# Patient Record
Sex: Male | Born: 1980 | Hispanic: Yes | Marital: Married | Smoking: Never smoker
Health system: Southern US, Community
[De-identification: ages and names within clinical notes are randomized; demographics above are authoritative.]

---

## 2019-04-25 ENCOUNTER — Emergency Department (HOSPITAL_COMMUNITY)
Admission: EM | Admit: 2019-04-25 | Discharge: 2019-04-26 | Disposition: A | Discharge status: Home / Self Care | Payer: HRSA Program | Attending: Emergency Medicine | Admitting: Emergency Medicine

## 2019-04-25 ENCOUNTER — Other Ambulatory Visit: Payer: Self-pay

## 2019-04-25 ENCOUNTER — Encounter (HOSPITAL_COMMUNITY): Payer: Self-pay

## 2019-04-25 ENCOUNTER — Emergency Department (HOSPITAL_COMMUNITY): Payer: HRSA Program

## 2019-04-25 DIAGNOSIS — R05 Cough: Secondary | ICD-10-CM | POA: Diagnosis present

## 2019-04-25 DIAGNOSIS — R0981 Nasal congestion: Secondary | ICD-10-CM | POA: Insufficient documentation

## 2019-04-25 DIAGNOSIS — R030 Elevated blood-pressure reading, without diagnosis of hypertension: Secondary | ICD-10-CM | POA: Diagnosis not present

## 2019-04-25 DIAGNOSIS — J189 Pneumonia, unspecified organism: Secondary | ICD-10-CM | POA: Diagnosis not present

## 2019-04-25 DIAGNOSIS — Z20828 Contact with and (suspected) exposure to other viral communicable diseases: Secondary | ICD-10-CM | POA: Diagnosis not present

## 2019-04-25 DIAGNOSIS — Z20822 Contact with and (suspected) exposure to covid-19: Secondary | ICD-10-CM

## 2019-04-25 MED ORDER — FLUTICASONE PROPIONATE 50 MCG/ACT NA SUSP: 1.0000 | Freq: Every day | NASAL | 0 refills | Status: DC

## 2019-04-25 MED ORDER — BENZONATATE 100 MG PO CAPS: 100.0000 mg | ORAL_CAPSULE | Freq: Three times a day (TID) | ORAL | 0 refills | Status: AC

## 2019-04-25 MED ORDER — AZITHROMYCIN 250 MG PO TABS: 250.0000 mg | ORAL_TABLET | Freq: Every day | ORAL | 0 refills | Status: AC

## 2019-04-25 NOTE — Discharge Instructions (Signed)
Take the medicines as prescribed.  Follow-up with primary care.  Return to the emergency department for any new worsening symptoms.  You will be called her COVID-19 results.

## 2019-04-25 NOTE — ED Triage Notes (Signed)
Pt reports nasal congestion for the past 3 days and slight cough, denies fever. Resp e/u.

## 2019-04-25 NOTE — ED Provider Notes (Signed)
[facility] EMERGENCY DEPARTMENT Provider Note   CSN: [id number] Arrival date & time: 04/25/19  2225    History   Chief Complaint Chief Complaint  Patient presents with  . Nasal Congestion    HPI Dale Rice is a 38 y.o. male with past medical history significant for hypertension who presents for evaluation of cough and nasal congestion.  Patient states he is had nonproductive cough x3 days.  Has had clear nasal congestion and rhinorrhea.  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  Denies fever, chills, nausea, vomiting, headache, neck pain, neck stiffness, sore throat, drooling, dysphasia, trismus, chest pain, shortness of breath, abdominal pain, diarrhea dysuria.  Has not taken anything for symptoms.  Denies additional aggravating or alleviating factors.  History obtained from patient and past medical records.  No interpreter was used.     HPI  History reviewed. No pertinent past medical history.  There are no active problems to display for this patient.   History reviewed. No pertinent surgical history.      Home Medications    Prior to Admission medications   Medication Sig Start Date End Date Taking? Authorizing Provider  azithromycin (ZITHROMAX) 250 MG tablet Take 1 tablet (250 mg total) by mouth daily. Take first 2 tablets together, then 1 every day until finished. 04/25/19   Toshiro Hanken A, PA-C  benzonatate (TESSALON) 100 MG capsule Take 1 capsule (100 mg total) by mouth every 8 (eight) hours. 04/25/19   Ahaana Rochette A, PA-C  fluticasone (FLONASE) 50 MCG/ACT nasal spray Place 1 spray into both nostrils daily. 04/25/19   Jannetta Massey A, PA-C    Family History No family history on file.  Social History Social History   Tobacco Use  . Smoking status: Not on file  Substance Use Topics  . Alcohol use: Not on file  . Drug use: Not on file     Allergies   Patient has no allergy  information on record.   Review of Systems Review of Systems  Constitutional: Negative.   HENT: Positive for congestion and rhinorrhea. Negative for drooling, ear discharge, ear pain, facial swelling, mouth sores, nosebleeds, sinus pressure, sinus pain, sneezing, sore throat, trouble swallowing and voice change.   Eyes: Negative.   Respiratory: Positive for cough. Negative for apnea, choking, chest tightness, shortness of breath, wheezing and stridor.   Cardiovascular: Negative.   Gastrointestinal: Negative.   Genitourinary: Negative.   Musculoskeletal: Negative.   Skin: Negative.   Neurological: Negative.   All other systems reviewed and are negative.    Physical Exam Updated Vital Signs BP (!) 176/107 (BP Location: Left Arm)   Pulse 97   Temp 98.1 F (36.7 C) (Oral)   Resp 16   SpO2 98%   Physical Exam Vitals signs and nursing note reviewed.  Constitutional:      General: He is not in acute distress.    Appearance: He is not ill-appearing, toxic-appearing or diaphoretic.  HENT:     Head: Normocephalic and atraumatic.     Jaw: There is normal jaw occlusion.     Right Ear: Tympanic membrane, ear canal and external ear normal. There is no impacted cerumen. No hemotympanum. Tympanic membrane is not injected, scarred, perforated, erythematous, retracted or bulging.     Left Ear: Tympanic membrane, ear canal and external ear normal. There is no impacted cerumen. No hemotympanum. Tympanic membrane is not injected, scarred, perforated, erythematous, retracted or bulging.  Ears:     Comments: No Mastoid tenderness.    Nose:     Comments: Clear rhinorrhea and congestion to bilateral nares.  No sinus tenderness.    Mouth/Throat:     Comments: Posterior oropharynx clear.  Mucous membranes moist.  Tonsils without erythema or exudate.  Uvula midline without deviation.  No evidence of PTA or RPA.  No drooling, dysphasia or trismus.  Phonation normal. Neck:     Trachea: Trachea and  phonation normal.     Meningeal: Brudzinski's sign and Kernig's sign absent.     Comments: No Neck stiffness or neck rigidity.  No meningismus.  No cervical lymphadenopathy. Cardiovascular:     Comments: No murmurs rubs or gallops. Pulmonary:     Comments: Clear to auscultation bilaterally without wheeze, rhonchi or rales.  No accessory muscle usage.  Able speak in full sentences. Abdominal:     Comments: Soft, nontender without rebound or guarding.  No CVA tenderness.  Musculoskeletal:     Comments: Moves all 4 extremities without difficulty.  Lower extremities without edema, erythema or warmth.  Skin:    Comments: Brisk capillary refill.  No rashes or lesions.  Neurological:     Mental Status: He is alert.     Comments: Ambulatory in department without difficulty.  Cranial nerves II through XII grossly intact.  No facial droop.  No aphasia.      ED Treatments / Results  Labs (all labs ordered are listed, but only abnormal results are displayed) Labs Reviewed  NOVEL CORONAVIRUS, NAA (HOSPITAL ORDER, SEND-OUT TO REF LAB)    EKG None  Radiology Dg Chest Portable 1 View  Result Date: 04/25/2019 CLINICAL DATA:  38 year old male with cough and congestion. Test for COVID-19 pending. EXAM: PORTABLE CHEST 1 VIEW COMPARISON:  None. FINDINGS: Portable AP semi upright view at 2313 hours. Peripheral, confluent and indistinct bilateral pulmonary opacity, relatively sparing the lung apices. No pleural effusion is evident. Cardiac and mediastinal contours are within normal limits. Visualized tracheal air column is within normal limits. Upper lung pulmonary vascularity appears normal. Negative visible osseous structures. Paucity of bowel gas in the upper abdomen. IMPRESSION: Bilateral confluent pulmonary opacity most suspicious for pneumonia, and consider viral/atypical etiology. No pleural effusion. Electronically Signed   By: Odessa FlemingH  Hall M.D.   On: 04/25/2019 23:36    Procedures Procedures  (including critical care time)  Medications Ordered in ED Medications - No data to display   Initial Impression / Assessment and Plan / ED Course  I have reviewed the triage vital signs and the nursing notes.  Pertinent labs & imaging results that were available during my care of the patient were reviewed by me and considered in my medical decision making (see chart for details).  38 year old male appears otherwise well presents for evaluation of upper respiratory symptoms.  He is afebrile, nonseptic, non-ill-appearing.  Posterior oropharynx clear.  Mucous membranes moist.  No evidence of PTA or RPA.  Tolerating p.o. intake at home.  Heart and lungs clear.  Speaks in full sentences without difficulty.  Clear rhinorrhea and congestion to bilateral nares.  Abdomen soft, nontender without rebound or guarding.  Chest x-ray with bilateral pulmonary opacities most suspicion for pneumonia, consider viral/atypical in etiology.  Given close contact with COVID exposure will obtain outpatient test.  He is not appear ill.  Does not meet Sirs or sepsis criteria.  He is without tachycardia, tachypnea or hypoxia.  He is hypertensive in the emergency department.  Has history of hypertension  is not followed by PCP for this.  He is without nausea, vomiting, headache, chest pain, shortness of breath, abdominal pain.  I have low suspicion for hypertensive urgency or emergency.  Discussed with patient DASH diet.  Do not think he needs to be started on blood pressure medicine at this time from the emergency department standpoint.  Will refer outpatient PCP for reevaluation of his blood pressure.  Possibly transient elevated due to his visit in the emergency department.  Strict return precautions with patient.  Patient voiced understanding is agreeable for follow-up.  The patient has been appropriately medically screened and/or stabilized in the ED. I have low suspicion for any other emergent medical condition which would  require further screening, evaluation or treatment in the ED or require inpatient management.  Patient is hemodynamically stable and in no acute distress.  Patient able to ambulate in department prior to ED.  Evaluation does not show acute pathology that would require ongoing or additional emergent interventions while in the emergency department or further inpatient treatment.  I have discussed the diagnosis with the patient and answered all questions.  Patient has no further complaints prior to discharge.  Patient is comfortable with plan discussed in room and is stable for discharge at this time.  I have discussed strict return precautions for returning to the emergency department.  Patient was encouraged to follow-up with PCP/specialist refer to at discharge.      Final Clinical Impressions(s) / ED Diagnoses   Final diagnoses:  Atypical pneumonia  Close Exposure to Covid-19 Virus  Elevated blood pressure, situational    ED Discharge Orders         Ordered    azithromycin (ZITHROMAX) 250 MG tablet  Daily     04/25/19 2357    benzonatate (TESSALON) 100 MG capsule  Every 8 hours     04/25/19 2357    fluticasone (FLONASE) 50 MCG/ACT nasal spray  Daily     04/25/19 2357           Hayden Kihara A, PA-C 04/26/19 0004    Gerhard MunchLockwood, Robert, MD 05/01/19 1914

## 2019-04-27 LAB — NOVEL CORONAVIRUS, NAA (HOSP ORDER, SEND-OUT TO REF LAB; TAT 18-24 HRS): SARS-CoV-2, NAA: NOT DETECTED

## 2019-05-13 ENCOUNTER — Emergency Department (HOSPITAL_COMMUNITY)
Admission: EM | Admit: 2019-05-13 | Discharge: 2019-05-13 | Disposition: A | Discharge status: Home / Self Care | Payer: Self-pay | Attending: Emergency Medicine | Admitting: Emergency Medicine

## 2019-05-13 ENCOUNTER — Encounter (HOSPITAL_COMMUNITY): Payer: Self-pay | Admitting: Emergency Medicine

## 2019-05-13 ENCOUNTER — Other Ambulatory Visit: Payer: Self-pay

## 2019-05-13 ENCOUNTER — Emergency Department (HOSPITAL_COMMUNITY): Payer: Self-pay

## 2019-05-13 DIAGNOSIS — R0789 Other chest pain: Secondary | ICD-10-CM | POA: Insufficient documentation

## 2019-05-13 DIAGNOSIS — R6889 Other general symptoms and signs: Secondary | ICD-10-CM | POA: Insufficient documentation

## 2019-05-13 DIAGNOSIS — R05 Cough: Secondary | ICD-10-CM | POA: Insufficient documentation

## 2019-05-13 DIAGNOSIS — Z20822 Contact with and (suspected) exposure to covid-19: Secondary | ICD-10-CM

## 2019-05-13 DIAGNOSIS — R0981 Nasal congestion: Secondary | ICD-10-CM | POA: Insufficient documentation

## 2019-05-13 MED ORDER — AZELASTINE HCL 0.15 % NA SOLN: NASAL | 0 refills | Status: AC

## 2019-05-13 MED ORDER — PROMETHAZINE-DM 6.25-15 MG/5ML PO SYRP: ORAL_SOLUTION | ORAL | 0 refills | Status: AC

## 2019-05-13 NOTE — ED Provider Notes (Signed)
[facility] EMERGENCY DEPARTMENT Provider Note   CSN: [id number] Arrival date & time: 05/13/19  2047    History   Chief Complaint Chief Complaint  Patient presents with  . Shortness of Breath    HPI Dale Rice is a 38 y.o. male with a history of nasal bone fracture who presents to the emergency department with a chief complaint of nasal congestion.  The patient was seen in the ER on July 14 and had a chest x-ray that was concerning for viral atypical pneumonia.  He had a COVID-19 test performed, which was negative, but given the patient's chest x-ray the test was suspected to be a false negative.  He reports that he had been having symptoms for 8 days prior to his evaluation in the ER, which included nonproductive cough, nasal congestion, loss of sense of smell, shortness of breath, and chest tightness.   He reports that the symptoms resolved, but nasal congestion returned 3 days ago.  He reports that he has been having some mild intermittent chest tightness, but no shortness of breath.  No pleuritic or exertional chest pain. No fevers, chills, rhinorrhea, sinus pain or pressure, facial swelling, sore throat, otalgia, postnasal drip, N/V/D, or epistaxis.   He reports a remote history of a nasal bone fracture several years ago when he was living in [place].     The history is provided by the patient. A language interpreter was used ([place]).    History reviewed. No pertinent past medical history.  There are no active problems to display for this patient.   History reviewed. No pertinent surgical history.      Home Medications    Prior to Admission medications   Medication Sig Start Date End Date Taking? Authorizing Provider  Azelastine HCl 0.15 % SOLN Insert 1 spray in each nostril 2 times daily as needed.  Inserte 1 spray en cada fosa nasal 2 veces al da segn sea necesario. 05/13/19   Noralee Dutko A, PA-C  azithromycin (ZITHROMAX) 250 MG tablet Take  1 tablet (250 mg total) by mouth daily. Take first 2 tablets together, then 1 every day until finished. 04/25/19   Henderly, Britni A, PA-C  benzonatate (TESSALON) 100 MG capsule Take 1 capsule (100 mg total) by mouth every 8 (eight) hours. 04/25/19   Henderly, Britni A, PA-C  fluticasone (FLONASE) 50 MCG/ACT nasal spray Place 1 spray into both nostrils daily. 04/25/19   Henderly, Britni A, PA-C  promethazine-dextromethorphan (PROMETHAZINE-DM) 6.25-15 MG/5ML syrup Take 5 mL by mouth every 6 hours as needed. Tome 5 ml por va oral cada 6 horas segn sea necesario. 05/13/19   Sharlotte Baka A, PA-C    Family History No family history on file.  Social History Social History   Tobacco Use  . Smoking status: Never Smoker  . Smokeless tobacco: Never Used  Substance Use Topics  . Alcohol use: Never    Frequency: Never  . Drug use: Never     Allergies   Patient has no known allergies.   Review of Systems Review of Systems  Constitutional: Negative for appetite change, chills and fever.  HENT: Positive for congestion. Negative for ear pain, facial swelling, nosebleeds, postnasal drip, rhinorrhea, sinus pressure, sore throat, trouble swallowing and voice change.   Eyes: Negative for photophobia and visual disturbance.  Respiratory: Positive for cough and chest tightness. Negative for apnea, choking, shortness of breath and wheezing.   Cardiovascular: Negative for chest pain, palpitations and leg swelling.  Gastrointestinal: Negative  for abdominal pain, diarrhea, nausea and vomiting.  Genitourinary: Negative for dysuria and urgency.  Musculoskeletal: Negative for back pain.  Skin: Negative for rash.  Allergic/Immunologic: Negative for immunocompromised state.  Neurological: Negative for dizziness, seizures, weakness and headaches.  Psychiatric/Behavioral: Negative for confusion.     Physical Exam Updated Vital Signs BP (!) 143/95   Pulse (!) 58   Temp 99.6 F (37.6 C) (Oral)   Resp  17   Wt 86.2 kg   SpO2 100%   Physical Exam Vitals signs and nursing note reviewed.  Constitutional:      General: He is not in acute distress.    Appearance: He is well-developed. He is not ill-appearing, toxic-appearing or diaphoretic.  HENT:     Head: Normocephalic.     Nose: Congestion present. No septal deviation or rhinorrhea.     Right Nostril: No epistaxis, septal hematoma or occlusion.     Left Nostril: No epistaxis, septal hematoma or occlusion.     Right Turbinates: Not swollen or pale.     Left Turbinates: Swollen. Not pale.     Right Sinus: No maxillary sinus tenderness or frontal sinus tenderness.     Left Sinus: No maxillary sinus tenderness or frontal sinus tenderness.  Eyes:     Extraocular Movements: Extraocular movements intact.     Conjunctiva/sclera: Conjunctivae normal.     Pupils: Pupils are equal, round, and reactive to light.  Neck:     Musculoskeletal: Normal range of motion and neck supple.  Cardiovascular:     Rate and Rhythm: Normal rate and regular rhythm.     Pulses: Normal pulses.     Heart sounds: Normal heart sounds. No murmur. No friction rub. No gallop.   Pulmonary:     Effort: Pulmonary effort is normal. No respiratory distress.     Breath sounds: No stridor. No wheezing, rhonchi or rales.  Chest:     Chest wall: No tenderness.  Abdominal:     General: There is no distension.     Palpations: Abdomen is soft.  Skin:    General: Skin is warm and dry.  Neurological:     Mental Status: He is alert.  Psychiatric:        Behavior: Behavior normal.      ED Treatments / Results  Labs (all labs ordered are listed, but only abnormal results are displayed) Labs Reviewed - No data to display  EKG None  Radiology Dg Chest Portable 1 View  Result Date: 05/13/2019 CLINICAL DATA:  Cough for 2 weeks.  Shortness of breath EXAM: PORTABLE CHEST 1 VIEW COMPARISON:  04/25/2019 FINDINGS: Normal heart size. No pleural effusion or edema. Significant  interval improvement in previous bilateral mid and lower lung zone predominant airspace opacities. No new findings. IMPRESSION: Interval clearing of bilateral airspace opacities. No new findings identified. Electronically Signed   By: Signa Kellaylor  Stroud M.D.   On: 05/13/2019 22:24    Procedures Procedures (including critical care time)  Medications Ordered in ED Medications - No data to display   Initial Impression / Assessment and Plan / ED Course  I have reviewed the triage vital signs and the nursing notes.  Pertinent labs & imaging results that were available during my care of the patient were reviewed by me and considered in my medical decision making (see chart for details).        38 year old male with a history of nasal bone fracture presenting with nasal congestion for the last 3 days.  He  has had some intermittent chest tightness and nonproductive cough, but no constitutional symptoms.  No nausea, vomiting, or diarrhea.  He was seen in the ER on July 14 had a chest x-ray that was concerning for viral atypical pneumonia after having a high risk exposure with a positive COVID-19 patient.  COVID test was ultimately negative.  Patient reports that symptoms began 8 days prior to visit on July 14.  Repeat chest x-ray during visit today is significantly improved.  Lungs are clear to auscultation with no increased work of breathing today.  No hypoxia or tachypnea.  He has no pleuritic or exertional chest pain.  PERC negative.  Low suspicion for ACS.   He is hemodynamically stable and in no acute distress.  Overall, physical exam is reassuring.  Will discharge with Promethazine DM and Azelastine nasal spray.  It sounds as if he has a chronic history of recurrent nasal congestion secondary to a previous nasal bone fracture.  Doubt sinusitis, septal hematoma. We will also refer the patient to ENT if chronic symptoms persist.  Return precautions to the ER given.  He is hemodynamically stable and in  no acute distress.  Safe for discharge home with outpatient follow-up.  Final Clinical Impressions(s) / ED Diagnoses   Final diagnoses:  Nasal congestion  Suspected Covid-19 Virus Infection    ED Discharge Orders         Ordered    promethazine-dextromethorphan (PROMETHAZINE-DM) 6.25-15 MG/5ML syrup     05/13/19 2246    Azelastine HCl 0.15 % SOLN     05/13/19 2246           Elese Rane A, PA-C 05/13/19 2323    Vanetta MuldersZackowski, Scott, MD 05/19/19 1557

## 2019-05-13 NOTE — ED Notes (Signed)
COVID test from 04/26/19 was NEGATIVE

## 2019-05-13 NOTE — ED Notes (Signed)
Patient verbalizes understanding of discharge instructions. Opportunity for questioning and answers were provided. Armband removed by staff, pt discharged from ED.  

## 2019-05-13 NOTE — Discharge Instructions (Addendum)
Gracias por permitirme cuidarlo hoy en el Departamento de Emergencias.  Tome 5 ml de Promethazine DM cada 6 horas segn sea necesario para la tos o la congestin nasal.  Inserte 1 spray de aerosol nasal Azelastine en cada fosa nasal 2 veces al da segn sea necesario.  Evite los medicamentos de venta libre como Afrin, ya que estos pueden empeorar sus sntomas.  Si contina teniendo congestin o sntomas relacionados con la rotura de la nariz del odo previamente, puede hacer un seguimiento con la Lawyer y la garganta del odo. Su oficina est en la lista de [place].  Su radiografa de trax no muestra neumona hoy. Como discutimos, su prueba COVID-19 fue negativa, pero dados sus sntomas y [place] radiografa de trax durante su visita a la sala de emergencias en julio, se sospechaba que en realidad pudo haber tenido COVID-19. Segn las [employer], ya no debera contagiarse si tuvo COVID-19.  Regrese al departamento de emergencias si desarrolla fiebres altas con congestin nasal, dolores de [place] severos y Systems analyst, Social research officer, government al mover los ojos, hinchazn alrededor de los ojos con congestin nasal severa u otros sntomas nuevos relacionados.  Thank you for allowing me to care for you today in the Emergency Department.   Take 5 mL's of Promethazine DM every 6 hours as needed for cough or nasal congestion.  Insert 1 spray of Azelastine nose spray into each nostril 2 times daily as needed.  Avoid over the counter medications such as Afrin as these may make your symptoms worse.  If you continue to have congestion or symptoms related to having previously broken ear nose, you can follow-up with ear nose and throat.  Their office is listed above.  Your chest x-ray does not show pneumonia today.  As we discussed, your COVID-19 test was negative, but given your symptoms and a chest x-ray during your ER visit in July, it was suspected that you may have actually had COVID-19.  Based on CDC guidelines, you  should no longer be contagious if you had COVID-19.  Return to the emergency department if you develop high fevers with nasal congestion, severe headaches and that fever, pain with movement of your eyes, swelling around the eyes with severe nasal congestion, or other new, concerning symptoms.

## 2019-05-13 NOTE — ED Triage Notes (Signed)
Patient reports persistent SOB with nasal congestion for several days , pt. stated positive Covid test last July 14 , denies fever or chills . Occasional dry cough .

## 2019-06-08 ENCOUNTER — Other Ambulatory Visit: Payer: Self-pay

## 2019-06-08 ENCOUNTER — Emergency Department (HOSPITAL_COMMUNITY): Payer: HRSA Program

## 2019-06-08 ENCOUNTER — Emergency Department (HOSPITAL_COMMUNITY)
Admission: EM | Admit: 2019-06-08 | Discharge: 2019-06-08 | Disposition: A | Discharge status: Home / Self Care | Payer: HRSA Program | Attending: Emergency Medicine | Admitting: Emergency Medicine

## 2019-06-08 ENCOUNTER — Encounter (HOSPITAL_COMMUNITY): Payer: Self-pay

## 2019-06-08 DIAGNOSIS — R0602 Shortness of breath: Secondary | ICD-10-CM | POA: Diagnosis not present

## 2019-06-08 DIAGNOSIS — R531 Weakness: Secondary | ICD-10-CM | POA: Diagnosis not present

## 2019-06-08 DIAGNOSIS — R0981 Nasal congestion: Secondary | ICD-10-CM | POA: Insufficient documentation

## 2019-06-08 DIAGNOSIS — Z20828 Contact with and (suspected) exposure to other viral communicable diseases: Secondary | ICD-10-CM | POA: Diagnosis not present

## 2019-06-08 MED ORDER — FLUTICASONE PROPIONATE 50 MCG/ACT NA SUSP: 2.0000 | Freq: Every day | NASAL | 0 refills | Status: AC

## 2019-06-08 MED ORDER — ALBUTEROL SULFATE HFA 108 (90 BASE) MCG/ACT IN AERS
2.0000 | INHALATION_SPRAY | Freq: Once | RESPIRATORY_TRACT | Status: AC
  Administered 2019-06-08: 23:00:00 2 via RESPIRATORY_TRACT
  Filled 2019-06-08: qty 6.7

## 2019-06-08 NOTE — ED Provider Notes (Signed)
[facility] EMERGENCY DEPARTMENT Provider Note   CSN: [id number] Arrival date & time: 06/08/19  2007     History   Chief Complaint Chief Complaint  Patient presents with  . Nasal Congestion  . Shortness of Breath    HPI Dale Rice is a 38 y.o. male.     HPI   Pt is a 38 y/o male who presents to the ED today for eval of generalized weakness and shortness of breath for the last 1.5 months. States that he was tested with COVID on 7/14 and tested negative however his cxr and sxs were concerning for covid. He has been seen in the ED several times since then and he has f/u with his pcp as an outpatient and had labs done which he brought with him. I reviewed these and the results are normal.   He feels sob while at rest and with moving. Denies chest pain or pain with inspiration. He denies any cough, fever or rhinorrhea. He does report nasal congestion  Denies leg pain/swelling, hemoptysis, recent surgery/trauma, recent long travel, hormone use, personal hx of cancer, or hx of DVT/PE.    History reviewed. No pertinent past medical history.  There are no active problems to display for this patient.   History reviewed. No pertinent surgical history.    Home Medications    Prior to Admission medications   Medication Sig Start Date End Date Taking? Authorizing Provider  Azelastine HCl 0.15 % SOLN Insert 1 spray in each nostril 2 times daily as needed.  Inserte 1 spray en cada fosa nasal 2 veces al da segn sea necesario. 05/13/19   McDonald, Mia A, PA-C  azithromycin (ZITHROMAX) 250 MG tablet Take 1 tablet (250 mg total) by mouth daily. Take first 2 tablets together, then 1 every day until finished. 04/25/19   Henderly, Britni A, PA-C  benzonatate (TESSALON) 100 MG capsule Take 1 capsule (100 mg total) by mouth every 8 (eight) hours. 04/25/19   Henderly, Britni A, PA-C  fluticasone (FLONASE) 50 MCG/ACT nasal spray Place 2 sprays into both nostrils daily. 06/08/19    Ronald Londo S, PA-C  promethazine-dextromethorphan (PROMETHAZINE-DM) 6.25-15 MG/5ML syrup Take 5 mL by mouth every 6 hours as needed. Tome 5 ml por va oral cada 6 horas segn sea necesario. 05/13/19   McDonald, Coral Else, PA-C    Family History History reviewed. No pertinent family history.  Social History Social History   Tobacco Use  . Smoking status: Never Smoker  . Smokeless tobacco: Never Used  Substance Use Topics  . Alcohol use: Never    Frequency: Never  . Drug use: Never     Allergies   Patient has no known allergies.   Review of Systems Review of Systems  Constitutional: Negative for chills and fever.  HENT: Negative for ear pain and sore throat.   Eyes: Negative for visual disturbance.  Respiratory: Positive for shortness of breath. Negative for cough.   Cardiovascular: Negative for chest pain.  Gastrointestinal: Negative for abdominal pain, constipation, diarrhea, nausea and vomiting.  Genitourinary: Negative for dysuria.  Musculoskeletal: Negative for back pain.  Skin: Negative for rash.  Neurological: Positive for weakness (generalized). Negative for dizziness, light-headedness and numbness.  All other systems reviewed and are negative.    Physical Exam Updated Vital Signs BP (!) 161/97 (BP Location: Right Arm)   Pulse 76   Temp 98.8 F (37.1 C) (Oral)   Resp 18   Ht 5\' 5"  (1.651 m)  Wt 86.2 kg   SpO2 99%   BMI 31.62 kg/m   Physical Exam Vitals signs and nursing note reviewed.  Constitutional:      General: He is not in acute distress.    Appearance: He is well-developed. He is not ill-appearing or toxic-appearing.  HENT:     Head: Normocephalic and atraumatic.  Eyes:     Conjunctiva/sclera: Conjunctivae normal.  Neck:     Musculoskeletal: Neck supple.  Cardiovascular:     Rate and Rhythm: Normal rate and regular rhythm.     Heart sounds: Normal heart sounds. No murmur.  Pulmonary:     Effort: Pulmonary effort is normal. No  respiratory distress.     Breath sounds: Normal breath sounds. No decreased breath sounds, wheezing, rhonchi or rales.  Abdominal:     Palpations: Abdomen is soft.     Tenderness: There is no abdominal tenderness. There is no guarding or rebound.  Musculoskeletal:     Right lower leg: He exhibits no tenderness. No edema.     Left lower leg: He exhibits no tenderness. No edema.  Skin:    General: Skin is warm and dry.  Neurological:     Mental Status: He is alert.     ED Treatments / Results  Labs (all labs ordered are listed, but only abnormal results are displayed) Labs Reviewed  NOVEL CORONAVIRUS, NAA (HOSP ORDER, SEND-OUT TO REF LAB; TAT 18-24 HRS)    EKG None  Radiology Dg Chest 2 View  Result Date: 06/08/2019 CLINICAL DATA:  Shortness of breath EXAM: CHEST - 2 VIEW COMPARISON:  05/13/2019 FINDINGS: Heart and mediastinal contours are within normal limits. No focal opacities or effusions. No acute bony abnormality. IMPRESSION: No active cardiopulmonary disease. Electronically Signed   By: Rolm Baptise M.D.   On: 06/08/2019 20:55    Procedures Procedures (including critical care time)  Medications Ordered in ED Medications  albuterol (VENTOLIN HFA) 108 (90 Base) MCG/ACT inhaler 2 puff (has no administration in time range)     Initial Impression / Assessment and Plan / ED Course  I have reviewed the triage vital signs and the nursing notes.  Pertinent labs & imaging results that were available during my care of the patient were reviewed by me and considered in my medical decision making (see chart for details).    Final Clinical Impressions(s) / ED Diagnoses   Final diagnoses:  Shortness of breath  Nasal congestion    Pt is a 38 y/o male who presents to the ED today for eval of generalized weakness and shortness of breath for the last 1.5 months. States that he was tested with COVID on 7/14 and tested negative however his cxr and sxs were concerning for covid. He  has been seen in the ED several times since then and he has f/u with his pcp as an outpatient and had labs done which he brought with him. I reviewed these and the results are normal.  He feels sob while at rest and with moving. Denies chest pain or pain with inspiration. He denies any cough, fever or rhinorrhea. He does report nasal congestion  Pt looks well on exam. He is satting at 100% throughout my eval. His lungs are CTAB. Heart with RRR.   CXR negative for pneumonia, pneumothorax or other abnormality.   Pt PERC negative, doubt PE. Sxs atypical for ACS. Sxs seem more likely related to his recent viral illness versus new infection.  I will retest him for covid. Advised  on hydration. Will give flonase for his nasal congestion and albuterol for his sob. He states he had a prior nasal injury and was told to follow-up with ENT but he has not done so yet and is requesting referral.  We will give him referral to ENT.  Advised follow-up with PCP and return to the ER for new or worsening symptoms.  Voiced understanding plan reasons to return. all questions answered.  Patient stable for discharge.  ------  Clenton PareFortino Montella was evaluated in Emergency Department on 06/08/2019 for the symptoms described in the history of present illness. He was evaluated in the context of the global COVID-19 pandemic, which necessitated consideration that the patient might be at risk for infection with the SARS-CoV-2 virus that causes COVID-19. Institutional protocols and algorithms that pertain to the evaluation of patients at risk for COVID-19 are in a state of rapid change based on information released by regulatory bodies including the CDC and federal and state organizations. These policies and algorithms were followed during the patient's care in the ED.   ED Discharge Orders         Ordered    fluticasone [facility](FLONASE) 50 MCG/ACT nasal spray  Daily     06/08/19 [id number]2317           Deaven Barron, Hiramortni S, PA-C 06/08/19 2317     Milagros Lollykstra, Richard S, MD 06/10/19 1141

## 2019-06-08 NOTE — ED Notes (Signed)
Discharge instructions: quarantine, COVID results, and medications discussed with pt. Pt verbalized understanding. No questions at this time.

## 2019-06-08 NOTE — ED Triage Notes (Signed)
Pt arrives POV for eval of congestion and dry cough x 1 month. Was dx'd w/ Covid on 7/14, has had negative test since then. Pt here today because he is experiencing SOB, and congestion.

## 2019-06-08 NOTE — ED Notes (Signed)
ED Provider at bedside. 

## 2019-06-08 NOTE — Discharge Instructions (Addendum)
Today you were were tested for the coronavirus.  The results will be available in the next 3 to 5 days.  If the results are positive the hospital will contact you.  If they are negative the hospital would not contact you.  You will need to self quarantine until you are aware of your results.  If they are positive you will need to self quarantine as directed below. ° °You should be isolated for at least 7 days since the onset of your symptoms AND >72 hours after symptoms resolution (absence of fever without the use of fever reducing medication and improvement in respiratory symptoms), whichever is longer ° °Please follow up with your primary care provider within 5-7 days for re-evaluation of your symptoms. If you do not have a primary care provider, information for a healthcare clinic has been provided for you to make arrangements for follow up care. Please return to the emergency department for any new or worsening symptoms. ° °

## 2019-06-10 LAB — NOVEL CORONAVIRUS, NAA (HOSP ORDER, SEND-OUT TO REF LAB; TAT 18-24 HRS): SARS-CoV-2, NAA: NOT DETECTED

## 2021-03-13 IMAGING — DX PORTABLE CHEST - 1 VIEW
1 series · 1 of 1 positions shown · non-contrast
Comparison: None.

CLINICAL DATA: 37-year-old male with cough and congestion. Test for
JOSVO-[id number] pending.

EXAM:
PORTABLE CHEST 1 VIEW

[chest]
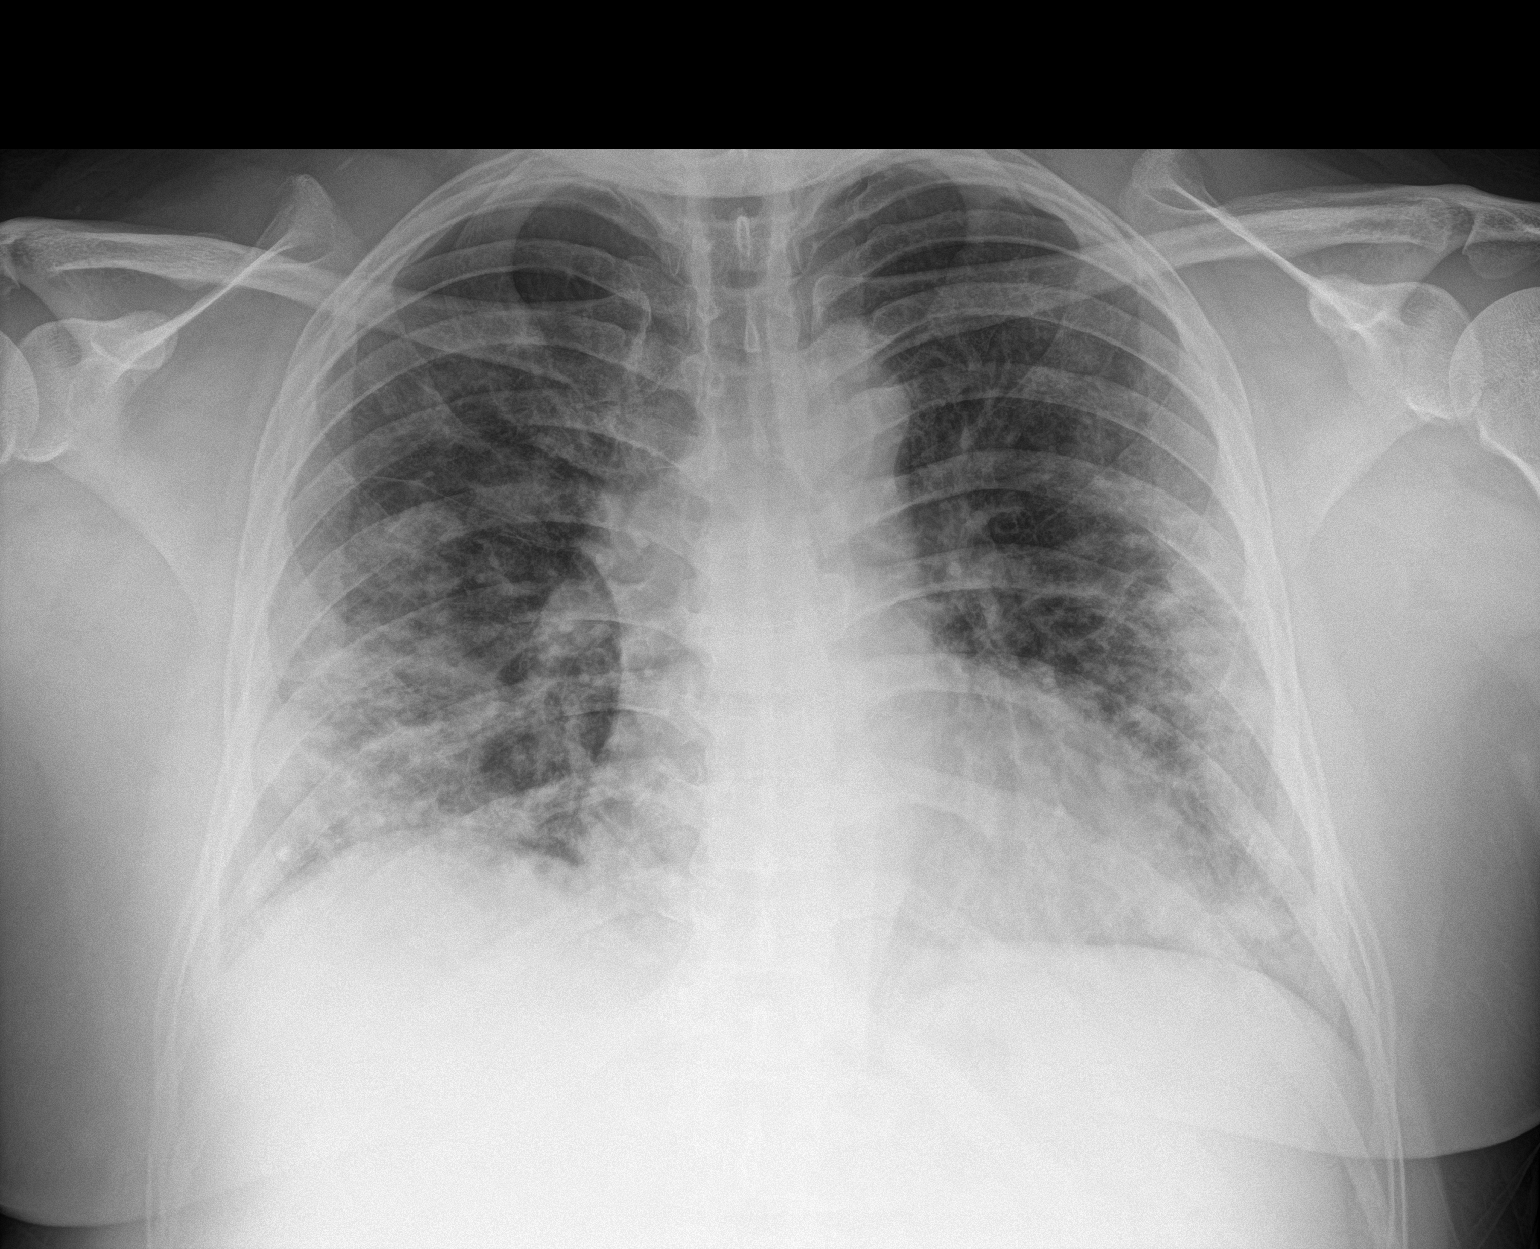

[1 of 1 positions shown; findings below may reference images not displayed]

FINDINGS: Portable AP semi upright view at 1727 hours. Peripheral, confluent
and indistinct bilateral pulmonary opacity, relatively sparing the
lung apices. No pleural effusion is evident. Cardiac and mediastinal
contours are within normal limits. Visualized tracheal air column is
within normal limits. Upper lung pulmonary vascularity appears
normal. Negative visible osseous structures. Paucity of bowel gas in
the upper abdomen.
IMPRESSION: Bilateral confluent pulmonary opacity most suspicious for pneumonia,
and consider viral/atypical etiology. No pleural effusion.

## 2021-03-31 IMAGING — DX PORTABLE CHEST - 1 VIEW
1 series · 1 of 1 positions shown · non-contrast
Comparison: 04/25/2019

CLINICAL DATA: Cough for 2 weeks.  Shortness of breath

EXAM:
PORTABLE CHEST 1 VIEW

[chest]
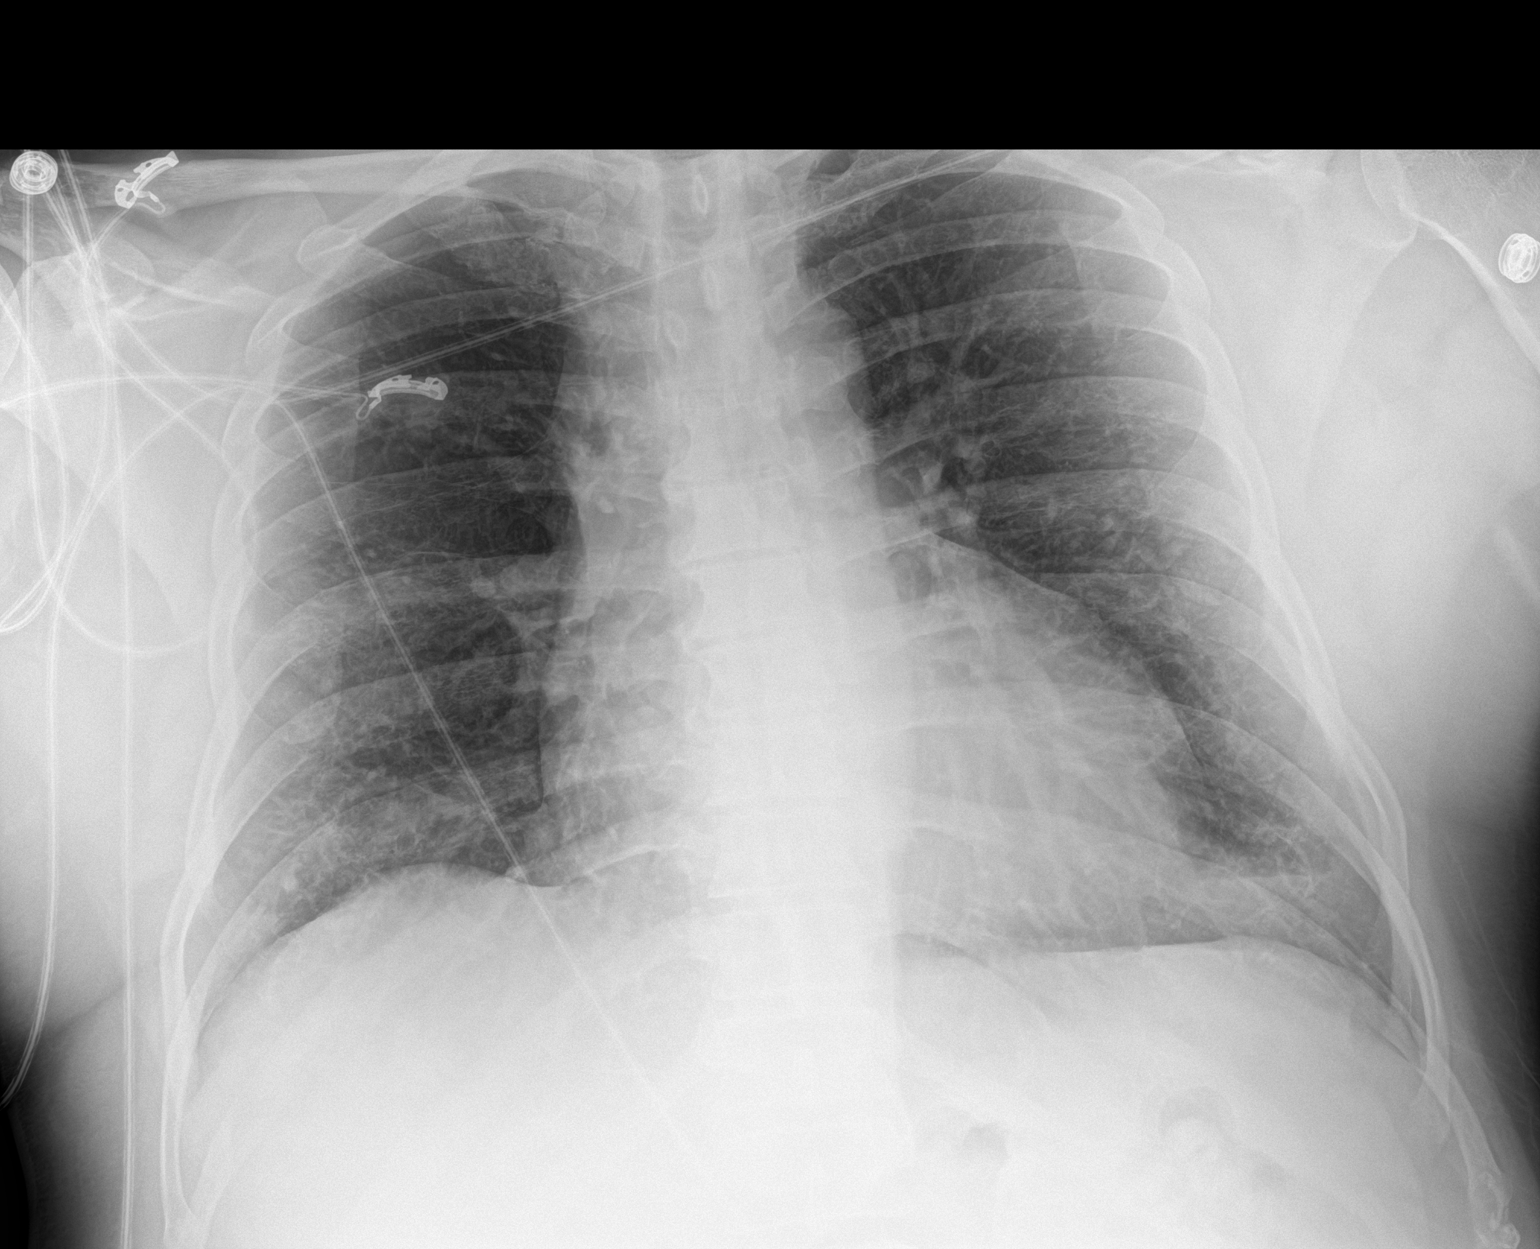

[1 of 1 positions shown; findings below may reference images not displayed]

FINDINGS: Normal heart size. No pleural effusion or edema. Significant
interval improvement in previous bilateral mid and lower lung zone
predominant airspace opacities. No new findings.
IMPRESSION: Interval clearing of bilateral airspace opacities. No new findings
identified.
